# Patient Record
Sex: Female | Born: 1975 | Race: White | Hispanic: Yes | Marital: Married
Health system: Southern US, Community
[De-identification: ages and names within clinical notes are randomized; demographics above are authoritative.]

---

## 2007-07-30 ENCOUNTER — Ambulatory Visit: Payer: Self-pay | Admitting: Family Medicine

## 2007-08-07 ENCOUNTER — Encounter: Payer: Self-pay | Admitting: Maternal & Fetal Medicine

## 2007-12-20 ENCOUNTER — Observation Stay: Payer: Self-pay

## 2007-12-27 ENCOUNTER — Inpatient Hospital Stay: Payer: Self-pay | Admitting: Certified Nurse Midwife

## 2011-11-20 ENCOUNTER — Ambulatory Visit: Payer: Self-pay | Admitting: Advanced Practice Midwife

## 2017-07-22 ENCOUNTER — Emergency Department (HOSPITAL_COMMUNITY): Payer: Self-pay

## 2017-07-22 ENCOUNTER — Encounter (HOSPITAL_COMMUNITY): Payer: Self-pay

## 2017-07-22 ENCOUNTER — Other Ambulatory Visit: Payer: Self-pay

## 2017-07-22 ENCOUNTER — Emergency Department (HOSPITAL_COMMUNITY)
Admission: EM | Admit: 2017-07-22 | Discharge: 2017-07-22 | Disposition: A | Discharge status: Home / Self Care | Payer: Self-pay | Attending: Emergency Medicine | Admitting: Emergency Medicine

## 2017-07-22 DIAGNOSIS — M79672 Pain in left foot: Secondary | ICD-10-CM | POA: Insufficient documentation

## 2017-07-22 MED ORDER — KETOROLAC TROMETHAMINE 30 MG/ML IJ SOLN
30.0000 mg | Freq: Once | INTRAMUSCULAR | Status: AC
  Administered 2017-07-22: 30 mg via INTRAMUSCULAR
  Filled 2017-07-22: qty 1

## 2017-07-22 MED ORDER — NAPROXEN 500 MG PO TABS: 500.0000 mg | ORAL_TABLET | Freq: Two times a day (BID) | ORAL | 0 refills | Status: AC

## 2017-07-22 NOTE — ED Notes (Signed)
See EDP assessment 

## 2017-07-22 NOTE — ED Notes (Signed)
Pt. Is grimacing due to left leg pain.

## 2017-07-22 NOTE — ED Triage Notes (Signed)
Pt presents to the ed with complaints of pain in her right foot. Denies any injury to her foot.

## 2017-07-22 NOTE — ED Notes (Signed)
Pt verbalizes understanding of d/c instructions. Pt received prescriptions. Pt ambulatory at d/c with all belongings.  

## 2017-07-22 NOTE — Discharge Instructions (Signed)
Please read attached information regarding your condition. Take naproxen to help with pain and swelling. Keep Ace wrap on foot as directed. Return to ED for worsening pain, red hot/tender joint, numbness in legs, injuries or falls.   Por favor lea la informacin adjunta sobre su condicin. Tome naproxeno para Engineer, materialsaliviar el dolor y la hinchazn. Mantenga Ace envolver a pie como se indica. Regrese a la ED para Administrator, artsempeorar el dolor, la articulacin al rojo vivo / sensible, entumecimiento de las piernas, lesiones o cadas.

## 2017-07-22 NOTE — ED Provider Notes (Signed)
[facility]   CSN: [id number] Arrival date & time: 07/22/17  1727     History   Chief Complaint Chief Complaint  Patient presents with  . Leg Pain    HPI Kerri Kelly is a 42 y.o. female who presents to ED for evaluation left dorsum of foot and great toe pain that has been intermittent for 4 months but has worsened since yesterday.  Cannot recall any inciting event or injury 4 months ago or yesterday and may have triggered the pain.  Pain is worse with weightbearing.  Mild improvement with Tylenol.  No history of gout, septic arthritis, diabetes, osteomyelitis, previous fracture, dislocations or procedures in the area, injuries, numbness in foot, history of DVT.  The history is provided by a relative.    History reviewed. No pertinent past medical history.  There are no active problems to display for this patient.   History reviewed. No pertinent surgical history.  OB History    No data available       Home Medications    Prior to Admission medications   Medication Sig Start Date End Date Taking? Authorizing Provider  naproxen (NAPROSYN) 500 MG tablet Take 1 tablet (500 mg total) by mouth 2 (two) times daily. 07/22/17   Dietrich PatesKhatri, Maleik Vanderzee, PA-C    Family History No family history on file.  Social History Social History   Tobacco Use  . Smoking status: Not on file  Substance Use Topics  . Alcohol use: Not on file  . Drug use: Not on file     Allergies   Patient has no known allergies.   Review of Systems Review of Systems  Constitutional: Negative for chills and fever.  Musculoskeletal: Positive for arthralgias and gait problem. Negative for back pain, joint swelling, myalgias, neck pain and neck stiffness.  Skin: Negative for color change and wound.  Neurological: Negative for weakness.     Physical Exam Updated Vital Signs BP 105/63 (BP Location: Right Arm)   Pulse 80   Resp 18   Wt 72.6  kg (160 lb)   SpO2 99%   Physical Exam  Constitutional: She appears well-developed and well-nourished. No distress.  HENT:  Head: Normocephalic and atraumatic.  Eyes: Conjunctivae and EOM are normal. No scleral icterus.  Neck: Normal range of motion.  Pulmonary/Chest: Effort normal. No respiratory distress.  Musculoskeletal: Normal range of motion. She exhibits tenderness. She exhibits no edema or deformity.       Feet:  TTP of L great toe with no erythema, edema or changes to ROM noted. TTP of dorsum of L foot at the indicated area. No changes to ROM of L ankle. 2+ DP pulse noted bilaterally. No calf tenderness, erythema or edema noted.  Neurological: She is alert.  Skin: No rash noted. She is not diaphoretic.  Psychiatric: She has a normal mood and affect.  Nursing note and vitals reviewed.    ED Treatments / Results  Labs (all labs ordered are listed, but only abnormal results are displayed) Labs Reviewed - No data to display  EKG  EKG Interpretation None       Radiology Dg Foot Complete Left  Result Date: 07/22/2017 CLINICAL DATA:  42 year old female with pain in the great toe of the left foot. EXAM: LEFT FOOT - COMPLETE 3+ VIEW COMPARISON:  None. FINDINGS: There is no acute fracture or dislocation. No arthritic changes. The bones are well mineralized. Mild subcutaneous edema. No radiopaque foreign object or  soft tissue gas. IMPRESSION: Negative. Electronically Signed   By: Elgie Collard M.D.   On: 07/22/2017 19:47    Procedures Procedures (including critical care time)  Medications Ordered in ED Medications  ketorolac (TORADOL) 30 MG/ML injection 30 mg (30 mg Intramuscular Given 07/22/17 2018)     Initial Impression / Assessment and Plan / ED Course  I have reviewed the triage vital signs and the nursing notes.  Pertinent labs & imaging results that were available during my care of the patient were reviewed by me and considered in my medical decision making  (see chart for details).     Patient presents to ED for evaluation of left dorsum of foot and great toe pain that is been intermittent for 4 months but has worsened day.  Cannot recall any inciting event that may have triggered the pain.  On physical exam there is tenderness to palpation at the indicated area as mentioned above.  There is no erythema, edema that would concern me for infectious cause. Area is CMS intact. She is able to ambulate. She is able to perform full active and passive range of motion of ankle and digits without difficulty.  She denies any history of gout.  X-ray returned as negative.  Symptoms could be due to inflammatory arthritis.  I doubt vascular cause of symptoms.  Will give Toradol, prescription for anti-inflammatories, Ace wrap and advised to follow-up with PCP for further evaluation.  Patient appears stable for discharge at this time.  Strict return precautions given.  Portions of this note were generated with Scientist, clinical (histocompatibility and immunogenetics). Dictation errors may occur despite best attempts at proofreading.   Final Clinical Impressions(s) / ED Diagnoses   Final diagnoses:  Left foot pain    ED Discharge Orders        Ordered    naproxen (NAPROSYN) 500 MG tablet  2 times daily     07/22/17 2022       Dietrich Pates, PA-C 07/22/17 2024    Pricilla Loveless, MD 07/25/17 [phone]

## 2018-09-20 IMAGING — DX DG FOOT COMPLETE 3+V*L*
3 series · 3 of 3 positions shown · non-contrast
Comparison: None.

CLINICAL DATA: 42-year-old female with pain in the great toe of the
left foot.

EXAM:
LEFT FOOT - COMPLETE 3+ VIEW

[foot ap]
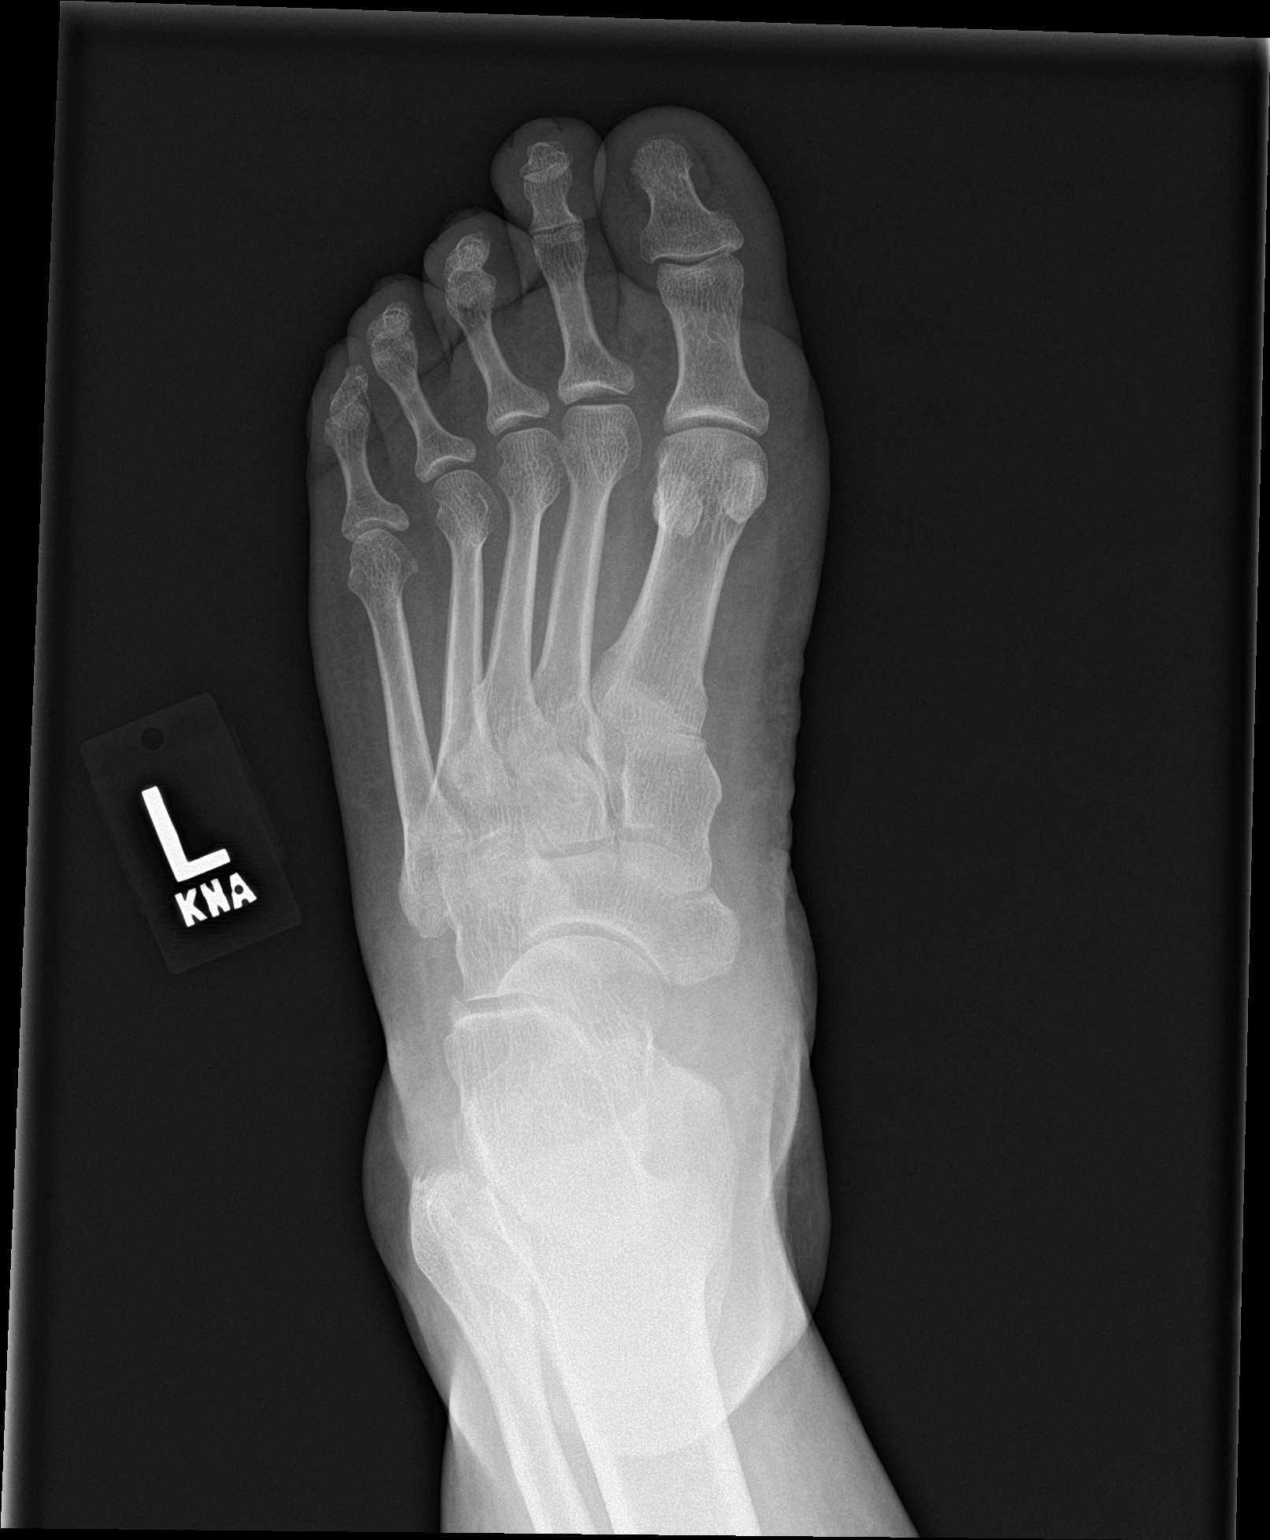

[foot obl]
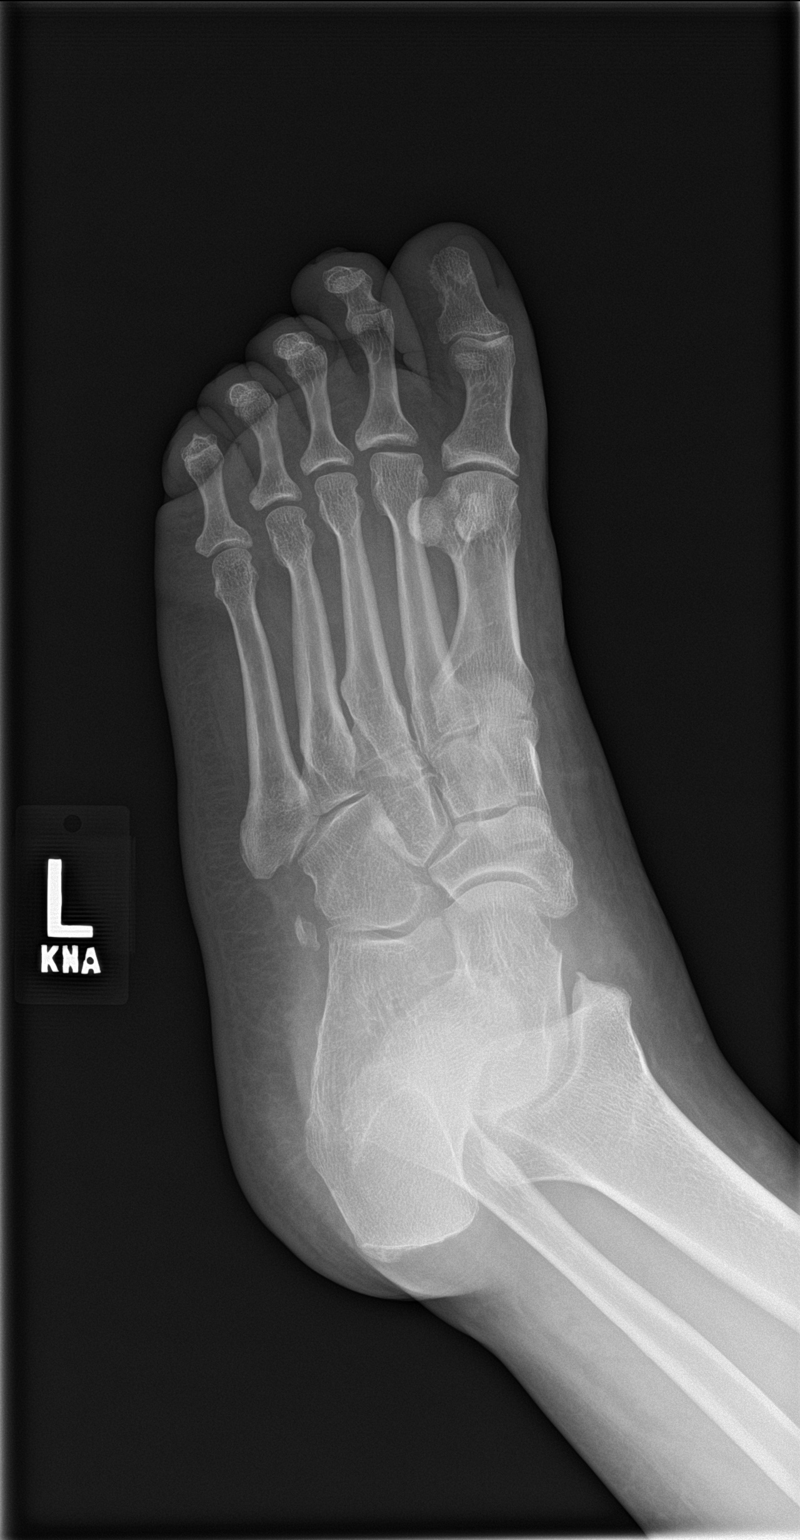

[foot lat]
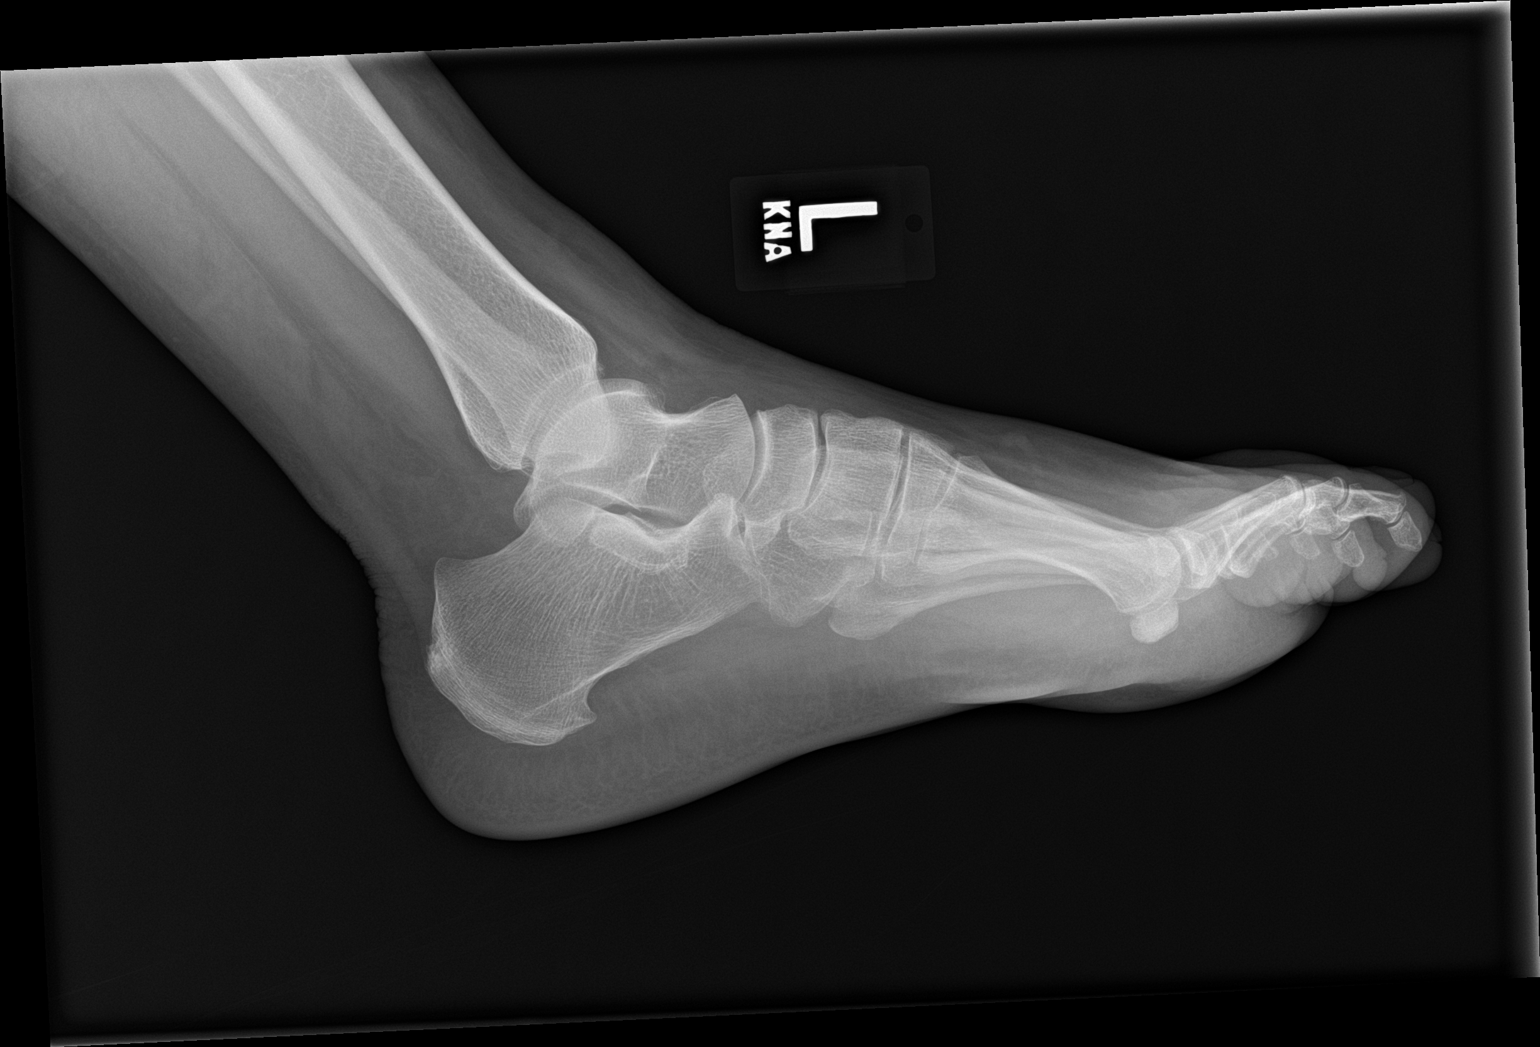

[3 of 3 positions shown; findings below may reference images not displayed]

FINDINGS: There is no acute fracture or dislocation. No arthritic changes. The
bones are well mineralized. Mild subcutaneous edema. No radiopaque
foreign object or soft tissue gas.
IMPRESSION: Negative.

## 2021-10-11 ENCOUNTER — Ambulatory Visit: Payer: Self-pay | Admitting: Podiatry
